# Patient Record
Sex: Female | Born: 1965 | Race: White | Hispanic: No | Marital: Married | Smoking: Never smoker
Health system: Southern US, Community
[De-identification: ages and names within clinical notes are randomized; demographics above are authoritative.]

---

## 1999-05-12 ENCOUNTER — Other Ambulatory Visit: Admission: RE | Admit: 1999-05-12 | Discharge: 1999-05-12 | Payer: Self-pay | Admitting: Obstetrics and Gynecology

## 2000-04-21 ENCOUNTER — Other Ambulatory Visit: Admission: RE | Admit: 2000-04-21 | Discharge: 2000-04-21 | Payer: Self-pay | Admitting: Obstetrics and Gynecology

## 2000-06-06 ENCOUNTER — Encounter: Payer: Self-pay | Admitting: Obstetrics and Gynecology

## 2000-06-06 ENCOUNTER — Ambulatory Visit (HOSPITAL_COMMUNITY): Admission: RE | Admit: 2000-06-06 | Discharge: 2000-06-06 | Payer: Self-pay | Admitting: Obstetrics and Gynecology

## 2001-04-24 ENCOUNTER — Other Ambulatory Visit: Admission: RE | Admit: 2001-04-24 | Discharge: 2001-04-24 | Payer: Self-pay | Admitting: Obstetrics and Gynecology

## 2001-08-07 ENCOUNTER — Ambulatory Visit (HOSPITAL_COMMUNITY): Admission: RE | Admit: 2001-08-07 | Discharge: 2001-08-07 | Payer: Self-pay | Admitting: *Deleted

## 2002-06-28 ENCOUNTER — Other Ambulatory Visit: Admission: RE | Admit: 2002-06-28 | Discharge: 2002-06-28 | Payer: Self-pay | Admitting: Obstetrics and Gynecology

## 2003-06-30 ENCOUNTER — Other Ambulatory Visit: Admission: RE | Admit: 2003-06-30 | Discharge: 2003-06-30 | Payer: Self-pay | Admitting: Obstetrics and Gynecology

## 2003-07-03 ENCOUNTER — Encounter: Admission: RE | Admit: 2003-07-03 | Discharge: 2003-07-03 | Payer: Self-pay | Admitting: Obstetrics and Gynecology

## 2004-06-25 ENCOUNTER — Other Ambulatory Visit: Admission: RE | Admit: 2004-06-25 | Discharge: 2004-06-25 | Payer: Self-pay | Admitting: Obstetrics and Gynecology

## 2004-07-08 ENCOUNTER — Encounter: Admission: RE | Admit: 2004-07-08 | Discharge: 2004-07-08 | Payer: Self-pay | Admitting: Obstetrics and Gynecology

## 2005-06-29 ENCOUNTER — Other Ambulatory Visit: Admission: RE | Admit: 2005-06-29 | Discharge: 2005-06-29 | Payer: Self-pay | Admitting: Obstetrics and Gynecology

## 2005-07-20 ENCOUNTER — Encounter: Admission: RE | Admit: 2005-07-20 | Discharge: 2005-07-20 | Payer: Self-pay | Admitting: Obstetrics and Gynecology

## 2006-07-24 ENCOUNTER — Encounter: Admission: RE | Admit: 2006-07-24 | Discharge: 2006-07-24 | Payer: Self-pay | Admitting: Obstetrics and Gynecology

## 2007-07-25 ENCOUNTER — Encounter: Admission: RE | Admit: 2007-07-25 | Discharge: 2007-07-25 | Payer: Self-pay | Admitting: Obstetrics and Gynecology

## 2008-07-25 ENCOUNTER — Encounter: Admission: RE | Admit: 2008-07-25 | Discharge: 2008-07-25 | Payer: Self-pay | Admitting: Obstetrics and Gynecology

## 2009-08-03 ENCOUNTER — Encounter: Admission: RE | Admit: 2009-08-03 | Discharge: 2009-08-03 | Payer: Self-pay | Admitting: Obstetrics and Gynecology

## 2009-08-06 ENCOUNTER — Encounter: Admission: RE | Admit: 2009-08-06 | Discharge: 2009-08-06 | Payer: Self-pay | Admitting: Obstetrics and Gynecology

## 2009-12-31 ENCOUNTER — Encounter: Admission: RE | Admit: 2009-12-31 | Discharge: 2009-12-31 | Payer: Self-pay | Admitting: Family Medicine

## 2010-07-12 ENCOUNTER — Other Ambulatory Visit: Payer: Self-pay | Admitting: Obstetrics and Gynecology

## 2010-07-12 DIAGNOSIS — Z1231 Encounter for screening mammogram for malignant neoplasm of breast: Secondary | ICD-10-CM

## 2010-08-10 ENCOUNTER — Ambulatory Visit
Admission: RE | Admit: 2010-08-10 | Discharge: 2010-08-10 | Disposition: A | Discharge status: Home / Self Care | Payer: 59 | Source: Ambulatory Visit | Attending: Obstetrics and Gynecology | Admitting: Obstetrics and Gynecology

## 2010-08-10 DIAGNOSIS — Z1231 Encounter for screening mammogram for malignant neoplasm of breast: Secondary | ICD-10-CM

## 2010-09-17 ENCOUNTER — Other Ambulatory Visit: Payer: Self-pay | Admitting: Obstetrics and Gynecology

## 2010-09-17 DIAGNOSIS — N6310 Unspecified lump in the right breast, unspecified quadrant: Secondary | ICD-10-CM

## 2010-09-23 ENCOUNTER — Ambulatory Visit
Admission: RE | Admit: 2010-09-23 | Discharge: 2010-09-23 | Disposition: A | Discharge status: Home / Self Care | Payer: 59 | Source: Ambulatory Visit | Attending: Obstetrics and Gynecology | Admitting: Obstetrics and Gynecology

## 2010-09-23 DIAGNOSIS — N6310 Unspecified lump in the right breast, unspecified quadrant: Secondary | ICD-10-CM

## 2010-10-01 NOTE — Op Note (Signed)
[facility]  Patient:    Lisa Benitez, Lisa Benitez Visit Number: [id number] MRN: [id number]          Service Type: DSU Location: [facility] Attending Physician:  Vikki Ports Dictated by:   Vikki Ports, M.D. Admit Date:  08/07/2001   CC:         Marina Gravel, M.D.   Operative Report  PREOPERATIVE DIAGNOSIS:       Umbilical hernia.  POSTOPERATIVE DIAGNOSIS:      Umbilical hernia.  PROCEDURE:                    Umbilical hernia repair.  SURGEON:                      Vikki Ports, M.D.  ANESTHESIA:                   General.  DESCRIPTION OF PROCEDURE:     The patient was taken to the operating room, placed in supine position and then in lithotomy.  After adequate anesthesia was induced, using endotracheal tube, the abdomen and perineum were prepped and draped in the normal sterile fashion.  A transverse incision was made just under the umbilicus.  Hernia sac was identified, dissected free of surrounding structures.  It was freed up off the fascia.  The peritoneum was opened, and an 0 Vicryl pursestring suture was placed around the fascial defect.  The Hasson trocar was placed in the abdomen, and Dr. Earlene Plater proceeded with bilateral tubal ligation.  This will be dictated in a separate report.  After he completed his part of the procedure, the Hasson trocar was removed.  The fascial defect measured about 1.5 cm.  It was closed with figure-of-eight 0 Vicryl sutures.  I was satisfied with the repair.  The skin was closed with subcuticular 4-0 Monocryl.  All tissues were injected using 0.5% Marcaine. The patient tolerated the procedure well and went to PACU in good condition. Dictated by:   Vikki Ports, M.D. Attending Physician:  Danna Hefty R. DD:  08/07/01 TD:  08/08/01 Job: [id number]

## 2010-10-01 NOTE — H&P (Signed)
[facility]  Patient:    Lisa Benitez, Lisa Benitez Visit Number: [id number] MRN: [id number]          Service Type: DSU Location: [facility] Attending Physician:  Jaymes Graff A Dictated by:   Marina Gravel, M.D. Adm. Date:  08/07/01   CC:         Vikki Ports, M.D., general surgery   History and Physical  DATE OF BIRTH:                [birth year]  SOCIAL SECURITY NUMBER:       [id number]  CHIEF COMPLAINT:              Desires tubal sterilization.  HISTORY OF PRESENT ILLNESS:   A 45 year old white female gravida 2 para 2 seen at the request of Dr. Danna Hefty.  Has plans for umbilical hernia repair on the above date and desire permanent tubal sterilization at the same time.  Is currently taking Ortho Tri-Cyclen for contraception and is aware of potential changes in menstrual cycle after discontinuation.  Has considered other alternative methods of contraception and has decided to proceed with tubal sterilization.  PAST MEDICAL HISTORY:         None.  PAST SURGICAL HISTORY:        Laser of cervix for dysplasia.  PAST OBSTETRICAL HISTORY:     Two spontaneous vaginal deliveries without complications.  MEDICATIONS:                  Ortho Tri-Cyclen.  ALLERGIES:                    None.  SOCIAL HISTORY:               No tobacco, social alcohol and other drugs.  FAMILY HISTORY:               Noncontributory.  REVIEW OF SYSTEMS:            Otherwise negative.  PHYSICAL EXAMINATION:  VITAL SIGNS:                  Blood pressure 100/60, pulse 76, weight 130, height 5 feet 6 inches.  GENERAL:                      The patient is normal body habitus, well developed, no deformities.  NECK:                         Supple, no thyromegaly.  HEART:                        Regular rate and rhythm.  LUNGS:                        Clear to auscultation.  ABDOMEN:                      Notable for a 1-2 cm reducible umbilical  hernia. Liver and spleen are normal and no other abdomen items are present.  LYMPH NODE SURVEY:            Negative at the neck.  SKIN:                         Normal without lesions.  PELVIC:  Deferred as she had a normal Pap smear in December 2002 and normal physical exam at that time.  Plan for exam under anesthesia prior to tubal sterilization.  ASSESSMENT:                   Desires permanent sterilization.  PLAN:                         Interval laparoscopic tubal sterilization and repair of umbilical hernia in combination with Dr. Danna Hefty. Operative risks for the tubal discussed including infection; bleeding; damage to bowel, bladder, surrounding organs.  Also discussed the permanent nature of sterilization, the failure rate of 2-3% for 10 years, increased ectopic risk if pregnant.  The patient has considered alternative methods such as IUD or continuing birth control pills but wishes to proceed with laparoscopic tubal. Dictated by:   Marina Gravel, M.D. Attending Physician:  Michael Litter DD:  07/27/01 TD:  07/27/01 Job: [id number]

## 2010-10-01 NOTE — Op Note (Signed)
[place]  Patient:    Lisa Benitez, Lisa Benitez Visit Number: [id number] MRN: [id number]          Service Type: DSU Location: [facility] Attending Physician:  Vikki Ports. Dictated by:   Marina Gravel, M.D. Proc. Date: 08/07/01 Admit Date:  08/07/2001                             Operative Report  PREOPERATIVE DIAGNOSES:       1. Undesired fertility.                               2. Umbilical hernia.  POSTOPERATIVE DIAGNOSES:      1. Undesired fertility.                               2. Umbilical hernia.  PROCEDURES:                   1. Open laparoscopy, laparoscopic tubal sterilization with bipolar cautery.                               2. Repair of umbilical hernia (Dr. Luan Pulling).  SURGEONS:                     Marina Gravel, M.D.                               Vikki Ports, M.D.  ANESTHESIA:                   General.  FINDINGS:                     A less than 1 cm umbilical hernia. Normal-appearing uterus, tubes and ovaries.  The remainder of the pelvis appeared normal.  Normal appearing appendix, liver edge, gallbladder and stomach.  INDICATIONS:                  The patient desires permanent sterilization. Failure rate, ectopic risk, operative risks were discussed prior to the procedure.  PROCEDURE:                    The patient was taken to the operating room and adequate general anesthesia obtained.  She was placed in the ski position and examined under anesthesia.  She was found to have an anterior uterus of normal size without adnexal masses.  Rectovaginal exam confirmed. She was prepped and draped in standard fashion, and bladder emptied with a red rubber catheter.  A speculum was inserted and the anterior lip of the cervix grasped with the toothed tenaculum, and Hulka tucked tenaculum inserted.  Attention was then turned to the umbilicus.  Dr. Luan Pulling entered the abdomen and placed a pursestring 0-Vicryl  suture around the fascial defect. We inserted a Hasson cannula through the defect and secured with a suture.  A pneumoperitoneum was obtained with CO2 gas.  The patient was placed in Trendelenburg position and bowel mobilized superiorly.  The pelvis and abdomen were inspected, with the above findings noted.  The right tube as identified and followed to its fimbriated end.  It was then cauteryized with bipolar cauter in a "triple  burn" fashion.  We repeated this on the opposite side in similar fashion.  The scope was removed and gas released.  The umbilical hernia closed by Dr. Luan Pulling.  (Please see her dictation for complete details in this regard).  The patient tolerated the procedure well and there were no complications. She was taken to the recovery room awake, alert and in stable condition. Dictated by:   Marina Gravel, M.D. Attending Physician:  Danna Hefty R. DD:  08/07/01 TD:  08/08/01 Job: [id number]

## 2011-03-01 ENCOUNTER — Other Ambulatory Visit: Payer: Self-pay | Admitting: Obstetrics and Gynecology

## 2011-03-01 DIAGNOSIS — N63 Unspecified lump in unspecified breast: Secondary | ICD-10-CM

## 2011-03-09 ENCOUNTER — Other Ambulatory Visit: Payer: Self-pay | Admitting: Obstetrics and Gynecology

## 2011-03-09 ENCOUNTER — Ambulatory Visit
Admission: RE | Admit: 2011-03-09 | Discharge: 2011-03-09 | Disposition: A | Discharge status: Home / Self Care | Payer: 59 | Source: Ambulatory Visit | Attending: Obstetrics and Gynecology | Admitting: Obstetrics and Gynecology

## 2011-03-09 DIAGNOSIS — N63 Unspecified lump in unspecified breast: Secondary | ICD-10-CM

## 2011-07-04 ENCOUNTER — Other Ambulatory Visit: Payer: Self-pay | Admitting: Obstetrics and Gynecology

## 2011-07-04 DIAGNOSIS — Z1231 Encounter for screening mammogram for malignant neoplasm of breast: Secondary | ICD-10-CM

## 2011-08-24 ENCOUNTER — Ambulatory Visit
Admission: RE | Admit: 2011-08-24 | Discharge: 2011-08-24 | Disposition: A | Discharge status: Home / Self Care | Payer: 59 | Source: Ambulatory Visit | Attending: Obstetrics and Gynecology | Admitting: Obstetrics and Gynecology

## 2011-08-24 DIAGNOSIS — Z1231 Encounter for screening mammogram for malignant neoplasm of breast: Secondary | ICD-10-CM

## 2011-09-19 ENCOUNTER — Ambulatory Visit (INDEPENDENT_AMBULATORY_CARE_PROVIDER_SITE_OTHER): Payer: 59 | Admitting: Obstetrics and Gynecology

## 2011-09-19 ENCOUNTER — Encounter: Payer: Self-pay | Admitting: Obstetrics and Gynecology

## 2011-09-19 VITALS — BP 106/68 | HR 70 | Ht 66.0 in | Wt 131.0 lb

## 2011-09-19 DIAGNOSIS — Z124 Encounter for screening for malignant neoplasm of cervix: Secondary | ICD-10-CM

## 2011-09-19 NOTE — Progress Notes (Signed)
Last Pap: 09/17/2010 WNL: Yes Regular Periods:yes Contraception: BTL, pill  Monthly Breast exam:yes Tetanus<4yrs:yes Nl.Bladder Function:yes Daily BMs:yes Healthy Diet:yes Calcium:yes Mammogram:yes April 2013 per pt Exercise:yes Seatbelt: yes Abuse at home: no Stressful work:no Sigmoid-colonoscopy: no Bone Density: No Jimmey Ralph, Charetha Subjective:  Physical Examination: Neck - supple, no significant adenopathy Chest - clear to auscultation, no wheezes, rales or rhonchi, symmetric air entry Heart - normal rate, regular rhythm, normal S1, S2, no murmurs, rubs, clicks or gallops Abdomen - soft, nontender, nondistended, no masses or organomegaly Breasts - breasts appear normal, no suspicious masses, no skin or nipple changes or axillary nodes Pelvic - normal external genitalia, vulva, vagina, cervix, uterus and adnexa, atrophic Rectal - normal rectal, no masses Musculoskeletal - no joint tenderness, deformity or swelling Extremities - peripheral pulses normal, no pedal edema, no clubbing or cyanosis Skin - normal coloration and turgor, no rashes, no suspicious skin lesions noted Normal AEX Pt due for mammogram done and normal per pt  colonoscopy due no Pap done repeat in 3 years RT one year Diet and exercise discussed

## 2011-09-21 LAB — PAP IG W/ RFLX HPV ASCU

## 2011-09-30 ENCOUNTER — Other Ambulatory Visit: Payer: Self-pay

## 2011-09-30 ENCOUNTER — Telehealth: Payer: Self-pay | Admitting: Obstetrics and Gynecology

## 2011-09-30 NOTE — Telephone Encounter (Signed)
Patient emailed the office and stated that she had an annual this month and her rx for microgestin had not been issued to [facility]. Please handle. Thanks AF

## 2011-09-30 NOTE — Telephone Encounter (Signed)
Niccole/epic/e-mail

## 2011-09-30 NOTE — Telephone Encounter (Signed)
Lm on vm rx sent to pharm can pick up at earliest convience

## 2011-09-30 NOTE — Telephone Encounter (Signed)
Patient emailed the office and stated that she had an annual this month and her rx for microgestin had not been issued to [facility] [address]. Please handle. Thanks AF   

## 2011-10-18 ENCOUNTER — Other Ambulatory Visit: Payer: Self-pay | Admitting: Obstetrics and Gynecology

## 2011-10-28 NOTE — Telephone Encounter (Signed)
Rx request received for Junel 1/20 e-pres to pharm.

## 2012-03-26 ENCOUNTER — Other Ambulatory Visit: Payer: Self-pay | Admitting: Obstetrics and Gynecology

## 2012-08-06 ENCOUNTER — Other Ambulatory Visit: Payer: Self-pay

## 2012-08-06 DIAGNOSIS — Z1231 Encounter for screening mammogram for malignant neoplasm of breast: Secondary | ICD-10-CM

## 2012-09-05 ENCOUNTER — Ambulatory Visit
Admission: RE | Admit: 2012-09-05 | Discharge: 2012-09-05 | Disposition: A | Discharge status: Home / Self Care | Payer: 59 | Source: Ambulatory Visit

## 2012-09-05 DIAGNOSIS — Z1231 Encounter for screening mammogram for malignant neoplasm of breast: Secondary | ICD-10-CM

## 2012-12-05 ENCOUNTER — Other Ambulatory Visit: Payer: Self-pay | Admitting: Family Medicine

## 2012-12-05 DIAGNOSIS — N6310 Unspecified lump in the right breast, unspecified quadrant: Secondary | ICD-10-CM

## 2012-12-05 DIAGNOSIS — N6320 Unspecified lump in the left breast, unspecified quadrant: Secondary | ICD-10-CM

## 2012-12-19 ENCOUNTER — Ambulatory Visit
Admission: RE | Admit: 2012-12-19 | Discharge: 2012-12-19 | Disposition: A | Discharge status: Home / Self Care | Payer: 59 | Source: Ambulatory Visit | Attending: Family Medicine | Admitting: Family Medicine

## 2012-12-19 DIAGNOSIS — N6320 Unspecified lump in the left breast, unspecified quadrant: Secondary | ICD-10-CM

## 2012-12-19 DIAGNOSIS — N6310 Unspecified lump in the right breast, unspecified quadrant: Secondary | ICD-10-CM

## 2013-07-03 ENCOUNTER — Other Ambulatory Visit: Payer: Self-pay

## 2013-07-03 DIAGNOSIS — Z1231 Encounter for screening mammogram for malignant neoplasm of breast: Secondary | ICD-10-CM

## 2013-09-06 ENCOUNTER — Ambulatory Visit
Admission: RE | Admit: 2013-09-06 | Discharge: 2013-09-06 | Disposition: A | Discharge status: Home / Self Care | Payer: BC Managed Care – PPO | Source: Ambulatory Visit

## 2013-09-06 DIAGNOSIS — Z1231 Encounter for screening mammogram for malignant neoplasm of breast: Secondary | ICD-10-CM

## 2014-10-14 ENCOUNTER — Other Ambulatory Visit: Payer: Self-pay

## 2014-10-14 DIAGNOSIS — Z1231 Encounter for screening mammogram for malignant neoplasm of breast: Secondary | ICD-10-CM

## 2014-10-20 ENCOUNTER — Ambulatory Visit
Admission: RE | Admit: 2014-10-20 | Discharge: 2014-10-20 | Disposition: A | Discharge status: Home / Self Care | Payer: BLUE CROSS/BLUE SHIELD | Source: Ambulatory Visit

## 2014-10-20 DIAGNOSIS — Z1231 Encounter for screening mammogram for malignant neoplasm of breast: Secondary | ICD-10-CM

## 2015-09-10 ENCOUNTER — Other Ambulatory Visit: Payer: Self-pay

## 2015-09-10 DIAGNOSIS — Z1231 Encounter for screening mammogram for malignant neoplasm of breast: Secondary | ICD-10-CM

## 2015-10-21 ENCOUNTER — Other Ambulatory Visit: Payer: Self-pay | Admitting: Obstetrics and Gynecology

## 2015-10-21 ENCOUNTER — Ambulatory Visit
Admission: RE | Admit: 2015-10-21 | Discharge: 2015-10-21 | Disposition: A | Discharge status: Home / Self Care | Payer: 59 | Source: Ambulatory Visit

## 2015-10-21 DIAGNOSIS — Z1231 Encounter for screening mammogram for malignant neoplasm of breast: Secondary | ICD-10-CM

## 2015-10-26 ENCOUNTER — Other Ambulatory Visit: Payer: Self-pay | Admitting: Obstetrics and Gynecology

## 2015-10-26 DIAGNOSIS — R928 Other abnormal and inconclusive findings on diagnostic imaging of breast: Secondary | ICD-10-CM

## 2015-10-30 ENCOUNTER — Ambulatory Visit
Admission: RE | Admit: 2015-10-30 | Discharge: 2015-10-30 | Disposition: A | Discharge status: Home / Self Care | Payer: 59 | Source: Ambulatory Visit | Attending: Obstetrics and Gynecology | Admitting: Obstetrics and Gynecology

## 2015-10-30 DIAGNOSIS — R928 Other abnormal and inconclusive findings on diagnostic imaging of breast: Secondary | ICD-10-CM

## 2016-01-04 ENCOUNTER — Other Ambulatory Visit: Payer: Self-pay | Admitting: Obstetrics and Gynecology

## 2016-01-04 DIAGNOSIS — N6009 Solitary cyst of unspecified breast: Secondary | ICD-10-CM

## 2016-01-07 ENCOUNTER — Other Ambulatory Visit: Payer: Self-pay | Admitting: Obstetrics and Gynecology

## 2016-01-07 ENCOUNTER — Ambulatory Visit
Admission: RE | Admit: 2016-01-07 | Discharge: 2016-01-07 | Disposition: A | Discharge status: Home / Self Care | Payer: 59 | Source: Ambulatory Visit | Attending: Obstetrics and Gynecology | Admitting: Obstetrics and Gynecology

## 2016-01-07 DIAGNOSIS — N6009 Solitary cyst of unspecified breast: Secondary | ICD-10-CM

## 2016-01-07 HISTORY — PX: BREAST CYST ASPIRATION: SHX578

## 2016-09-20 ENCOUNTER — Other Ambulatory Visit: Payer: Self-pay | Admitting: Obstetrics and Gynecology

## 2016-09-20 DIAGNOSIS — Z1231 Encounter for screening mammogram for malignant neoplasm of breast: Secondary | ICD-10-CM

## 2016-10-15 DIAGNOSIS — J028 Acute pharyngitis due to other specified organisms: Secondary | ICD-10-CM | POA: Diagnosis not present

## 2016-10-21 ENCOUNTER — Ambulatory Visit
Admission: RE | Admit: 2016-10-21 | Discharge: 2016-10-21 | Disposition: A | Discharge status: Home / Self Care | Payer: 59 | Source: Ambulatory Visit | Attending: Obstetrics and Gynecology | Admitting: Obstetrics and Gynecology

## 2016-10-21 DIAGNOSIS — Z1231 Encounter for screening mammogram for malignant neoplasm of breast: Secondary | ICD-10-CM

## 2016-11-01 DIAGNOSIS — Z0000 Encounter for general adult medical examination without abnormal findings: Secondary | ICD-10-CM | POA: Diagnosis not present

## 2016-11-08 DIAGNOSIS — Z0000 Encounter for general adult medical examination without abnormal findings: Secondary | ICD-10-CM | POA: Diagnosis not present

## 2017-04-12 DIAGNOSIS — Z23 Encounter for immunization: Secondary | ICD-10-CM | POA: Diagnosis not present

## 2017-06-13 DIAGNOSIS — Z0100 Encounter for examination of eyes and vision without abnormal findings: Secondary | ICD-10-CM | POA: Diagnosis not present

## 2017-06-20 ENCOUNTER — Ambulatory Visit: Payer: Self-pay | Admitting: Family

## 2017-06-20 ENCOUNTER — Encounter: Payer: Self-pay | Admitting: Family

## 2017-06-20 VITALS — BP 100/75 | Temp 99.0°F | Resp 16 | Wt 139.6 lb

## 2017-06-20 DIAGNOSIS — J029 Acute pharyngitis, unspecified: Secondary | ICD-10-CM

## 2017-06-20 MED ORDER — AMOXICILLIN 500 MG PO CAPS: 500.0000 mg | ORAL_CAPSULE | Freq: Two times a day (BID) | ORAL | 0 refills | Status: AC

## 2017-06-20 NOTE — Progress Notes (Signed)
   Subjective:    Patient ID: Lisa Benitez, female    DOB: 07/23/1965, 52 y.o.   MRN: [id number]  Sore Throat   This is a new problem. The current episode started in the past 7 days. The problem has been rapidly worsening. Maximum temperature: 99. The pain is at a severity of 8/10. The pain is moderate. Associated symptoms include ear pain, a hoarse voice, a plugged ear sensation and trouble swallowing. Pertinent negatives include no congestion, coughing, ear discharge or headaches. She has tried acetaminophen, gargles and NSAIDs for the symptoms. The treatment provided mild relief.  Sinus Problem  Associated symptoms include ear pain and a hoarse voice. Pertinent negatives include no congestion, coughing or headaches.  Ear Fullness   Pertinent negatives include no coughing, ear discharge or headaches.      Review of Systems  HENT: Positive for ear pain, hoarse voice and trouble swallowing. Negative for congestion and ear discharge.   Respiratory: Negative for cough.   Neurological: Negative for headaches.  All other systems reviewed and are negative.      Objective:   Physical Exam  Constitutional: She is oriented to person, place, and time. She appears well-developed and well-nourished. No distress.  HENT:  Head: Normocephalic and atraumatic.  Right Ear: External ear normal.  Nose: Rhinorrhea present.  Mouth/Throat: Posterior oropharyngeal erythema present.  Eyes: Pupils are equal, round, and reactive to light.  Neck: Normal range of motion. Neck supple. No thyromegaly present.  Cardiovascular: Normal rate, regular rhythm, normal heart sounds and intact distal pulses.  No murmur heard. Pulmonary/Chest: Effort normal and breath sounds normal. No respiratory distress. She has no wheezes.  Abdominal: Soft. Bowel sounds are normal. She exhibits no distension. There is no tenderness.  Musculoskeletal: Normal range of motion. She exhibits no edema or tenderness.   Neurological: She is alert and oriented to person, place, and time. She has normal reflexes. No cranial nerve deficit.  Skin: Skin is warm and dry.  Psychiatric: She has a normal mood and affect. Her behavior is normal. Judgment and thought content normal.  Vitals reviewed.     BP 100/75 (BP Location: Right Arm, Patient Position: Sitting, Cuff Size: Normal)   Temp 99 F (37.2 C)   Resp 16   Wt 139 lb 9.6 oz (63.3 kg)   BMI 22.53 kg/m      Assessment & Plan:  1. Acute pharyngitis, unspecified etiology - Take meds as prescribed - Use a cool mist humidifier  -Force fluids -For any cough or congestion  Use plain Mucinex- regular strength or max strength is fine -For fever or aces or pains- take tylenol or ibuprofen appropriate for age and weight. -Throat lozenges if help -New toothbrush in 3 days - amoxicillin (AMOXIL) 500 MG capsule; Take 1 capsule (500 mg total) by mouth 2 (two) times daily.  Dispense: 20 capsule; Refill: 0    Evelina Dun, FNP

## 2017-06-20 NOTE — Patient Instructions (Signed)
Strep Throat Strep throat is a bacterial infection of the throat. Your health care provider may call the infection tonsillitis or pharyngitis, depending on whether there is swelling in the tonsils or at the back of the throat. Strep throat is most common during the cold months of the year in children who are 5-53 years of age, but it can happen during any season in people of any age. This infection is spread from person to person (contagious) through coughing, sneezing, or close contact. What are the causes? Strep throat is caused by the bacteria called Streptococcus pyogenes. What increases the risk? This condition is more likely to develop in:  People who spend time in crowded places where the infection can spread easily.  People who have close contact with someone who has strep throat.  What are the signs or symptoms? Symptoms of this condition include:  Fever or chills.  Redness, swelling, or pain in the tonsils or throat.  Pain or difficulty when swallowing.  White or yellow spots on the tonsils or throat.  Swollen, tender glands in the neck or under the jaw.  Red rash all over the body (rare).  How is this diagnosed? This condition is diagnosed by performing a rapid strep test or by taking a swab of your throat (throat culture test). Results from a rapid strep test are usually ready in a few minutes, but throat culture test results are available after one or two days. How is this treated? This condition is treated with antibiotic medicine. Follow these instructions at home: Medicines  Take over-the-counter and prescription medicines only as told by your health care provider.  Take your antibiotic as told by your health care provider. Do not stop taking the antibiotic even if you start to feel better.  Have family members who also have a sore throat or fever tested for strep throat. They may need antibiotics if they have the strep infection. Eating and drinking  Do not  share food, drinking cups, or personal items that could cause the infection to spread to other people.  If swallowing is difficult, try eating soft foods until your sore throat feels better.  Drink enough fluid to keep your urine clear or pale yellow. General instructions  Gargle with a salt-water mixture 3-4 times per day or as needed. To make a salt-water mixture, completely dissolve -1 tsp of salt in 1 cup of warm water.  Make sure that all household members wash their hands well.  Get plenty of rest.  Stay home from school or work until you have been taking antibiotics for 24 hours.  Keep all follow-up visits as told by your health care provider. This is important. Contact a health care provider if:  The glands in your neck continue to get bigger.  You develop a rash, cough, or earache.  You cough up a thick liquid that is green, yellow-brown, or bloody.  You have pain or discomfort that does not get better with medicine.  Your problems seem to be getting worse rather than better.  You have a fever. Get help right away if:  You have new symptoms, such as vomiting, severe headache, stiff or painful neck, chest pain, or shortness of breath.  You have severe throat pain, drooling, or changes in your voice.  You have swelling of the neck, or the skin on the neck becomes red and tender.  You have signs of dehydration, such as fatigue, dry mouth, and decreased urination.  You become increasingly sleepy, or   you cannot wake up completely.  Your joints become red or painful. This information is not intended to replace advice given to you by your health care provider. Make sure you discuss any questions you have with your health care provider. Document Released: 04/29/2000 Document Revised: 12/30/2015 Document Reviewed: 08/25/2014 Elsevier Interactive Patient Education  2018 [employer]  

## 2017-06-22 ENCOUNTER — Telehealth: Payer: Self-pay

## 2017-06-22 NOTE — Telephone Encounter (Signed)
Called to f/u with pt to see how she was feeling since her visit with [place] and pt states she is doing much better.

## 2017-06-23 ENCOUNTER — Other Ambulatory Visit: Payer: Self-pay | Admitting: Obstetrics and Gynecology

## 2017-06-23 DIAGNOSIS — Z1231 Encounter for screening mammogram for malignant neoplasm of breast: Secondary | ICD-10-CM

## 2017-06-28 DIAGNOSIS — L814 Other melanin hyperpigmentation: Secondary | ICD-10-CM | POA: Diagnosis not present

## 2017-06-28 DIAGNOSIS — D485 Neoplasm of uncertain behavior of skin: Secondary | ICD-10-CM | POA: Diagnosis not present

## 2017-06-28 DIAGNOSIS — D2239 Melanocytic nevi of other parts of face: Secondary | ICD-10-CM | POA: Diagnosis not present

## 2017-06-28 DIAGNOSIS — L573 Poikiloderma of Civatte: Secondary | ICD-10-CM | POA: Diagnosis not present

## 2017-06-28 DIAGNOSIS — L430 Hypertrophic lichen planus: Secondary | ICD-10-CM | POA: Diagnosis not present

## 2017-09-12 DIAGNOSIS — N921 Excessive and frequent menstruation with irregular cycle: Secondary | ICD-10-CM | POA: Diagnosis not present

## 2017-09-19 DIAGNOSIS — R946 Abnormal results of thyroid function studies: Secondary | ICD-10-CM | POA: Diagnosis not present

## 2017-10-23 ENCOUNTER — Ambulatory Visit
Admission: RE | Admit: 2017-10-23 | Discharge: 2017-10-23 | Disposition: A | Discharge status: Home / Self Care | Payer: 59 | Source: Ambulatory Visit | Attending: Obstetrics and Gynecology | Admitting: Obstetrics and Gynecology

## 2017-10-23 ENCOUNTER — Encounter: Payer: Self-pay | Admitting: Radiology

## 2017-10-23 DIAGNOSIS — Z1231 Encounter for screening mammogram for malignant neoplasm of breast: Secondary | ICD-10-CM | POA: Diagnosis not present

## 2018-08-27 DIAGNOSIS — L570 Actinic keratosis: Secondary | ICD-10-CM | POA: Diagnosis not present

## 2018-08-27 DIAGNOSIS — D2261 Melanocytic nevi of right upper limb, including shoulder: Secondary | ICD-10-CM | POA: Diagnosis not present

## 2018-08-27 DIAGNOSIS — D2262 Melanocytic nevi of left upper limb, including shoulder: Secondary | ICD-10-CM | POA: Diagnosis not present

## 2018-08-27 DIAGNOSIS — I788 Other diseases of capillaries: Secondary | ICD-10-CM | POA: Diagnosis not present

## 2018-09-14 ENCOUNTER — Other Ambulatory Visit: Payer: Self-pay | Admitting: Obstetrics and Gynecology

## 2018-09-14 DIAGNOSIS — Z1231 Encounter for screening mammogram for malignant neoplasm of breast: Secondary | ICD-10-CM

## 2018-11-08 ENCOUNTER — Ambulatory Visit: Payer: 59

## 2018-11-15 ENCOUNTER — Ambulatory Visit
Admission: RE | Admit: 2018-11-15 | Discharge: 2018-11-15 | Disposition: A | Discharge status: Home / Self Care | Payer: 59 | Source: Ambulatory Visit | Attending: Obstetrics and Gynecology | Admitting: Obstetrics and Gynecology

## 2018-11-15 ENCOUNTER — Other Ambulatory Visit: Payer: Self-pay

## 2018-11-15 DIAGNOSIS — Z1231 Encounter for screening mammogram for malignant neoplasm of breast: Secondary | ICD-10-CM

## 2018-11-21 ENCOUNTER — Ambulatory Visit: Payer: 59

## 2019-09-10 ENCOUNTER — Other Ambulatory Visit: Payer: Self-pay | Admitting: Obstetrics and Gynecology

## 2019-09-10 DIAGNOSIS — Z1231 Encounter for screening mammogram for malignant neoplasm of breast: Secondary | ICD-10-CM

## 2019-11-25 ENCOUNTER — Ambulatory Visit
Admission: RE | Admit: 2019-11-25 | Discharge: 2019-11-25 | Disposition: A | Discharge status: Home / Self Care | Payer: 59 | Source: Ambulatory Visit | Attending: Obstetrics and Gynecology | Admitting: Obstetrics and Gynecology

## 2019-11-25 ENCOUNTER — Other Ambulatory Visit: Payer: Self-pay

## 2019-11-25 DIAGNOSIS — Z1231 Encounter for screening mammogram for malignant neoplasm of breast: Secondary | ICD-10-CM

## 2020-09-08 ENCOUNTER — Other Ambulatory Visit: Payer: Self-pay | Admitting: Obstetrics and Gynecology

## 2020-09-08 DIAGNOSIS — Z1231 Encounter for screening mammogram for malignant neoplasm of breast: Secondary | ICD-10-CM

## 2020-11-26 ENCOUNTER — Ambulatory Visit
Admission: RE | Admit: 2020-11-26 | Discharge: 2020-11-26 | Disposition: A | Discharge status: Home / Self Care | Payer: 59 | Source: Ambulatory Visit | Attending: Obstetrics and Gynecology | Admitting: Obstetrics and Gynecology

## 2020-11-26 ENCOUNTER — Other Ambulatory Visit: Payer: Self-pay

## 2020-11-26 DIAGNOSIS — Z1231 Encounter for screening mammogram for malignant neoplasm of breast: Secondary | ICD-10-CM

## 2021-08-08 IMAGING — MG MM DIGITAL SCREENING BILAT W/ TOMO AND CAD
8 series · 9 of 24 positions shown · non-contrast
Comparison: Previous exam(s).

CLINICAL DATA: Screening.

EXAM:
DIGITAL SCREENING BILATERAL MAMMOGRAM WITH TOMOSYNTHESIS AND CAD
TECHNIQUE: Bilateral screening digital craniocaudal and mediolateral oblique
mammograms were obtained. Bilateral screening digital breast
tomosynthesis was performed. The images were evaluated with
computer-aided detection.

[L CC synth-2D]
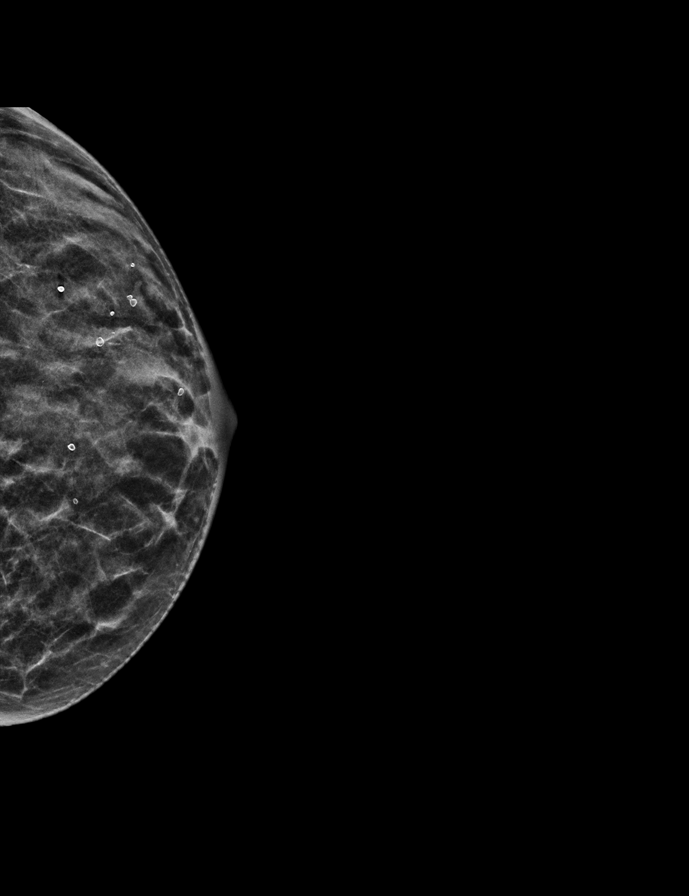

[L MLO synth-2D]
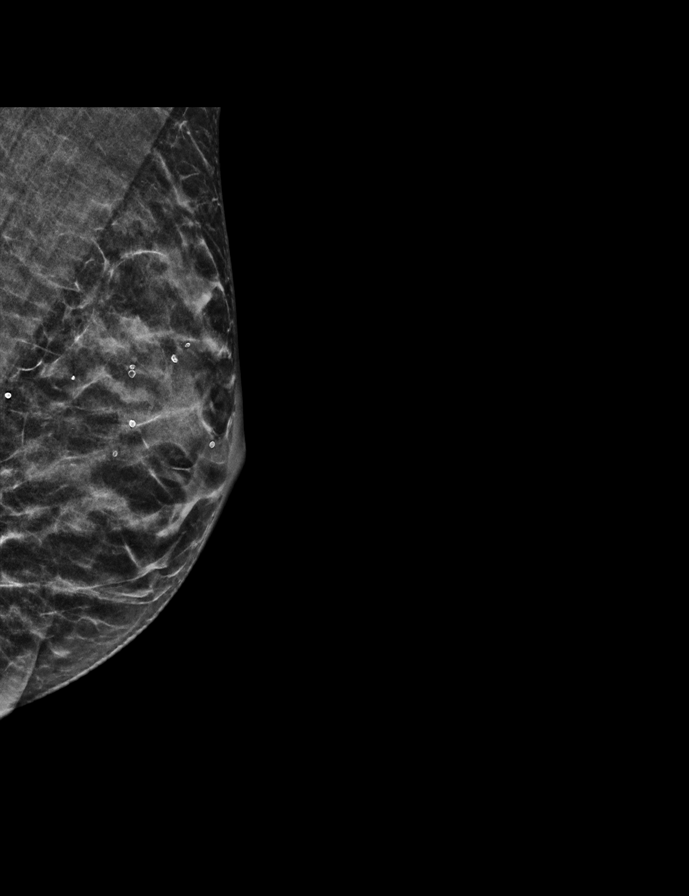

[R CC synth-2D]
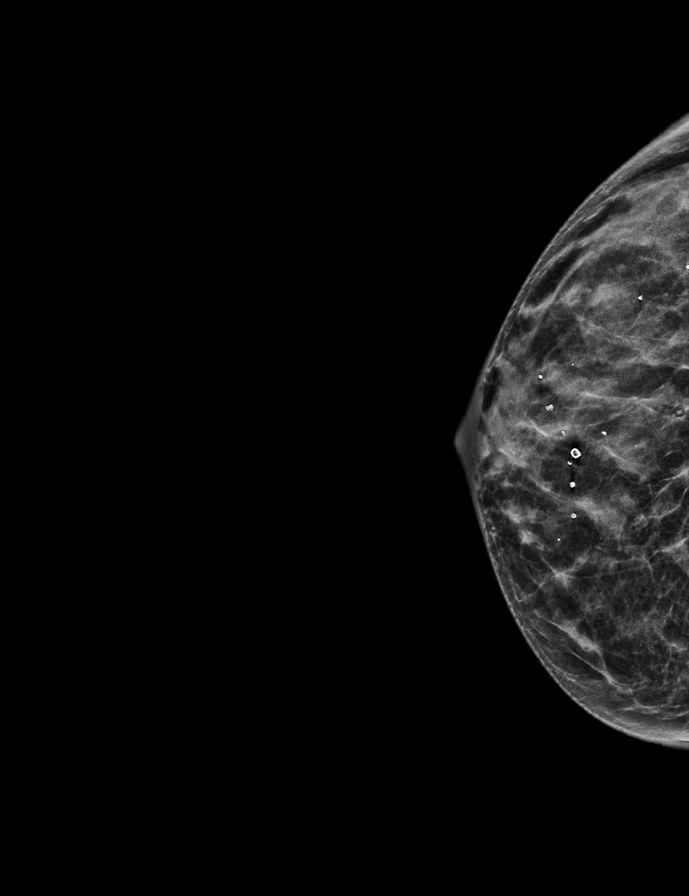

[R MLO synth-2D]
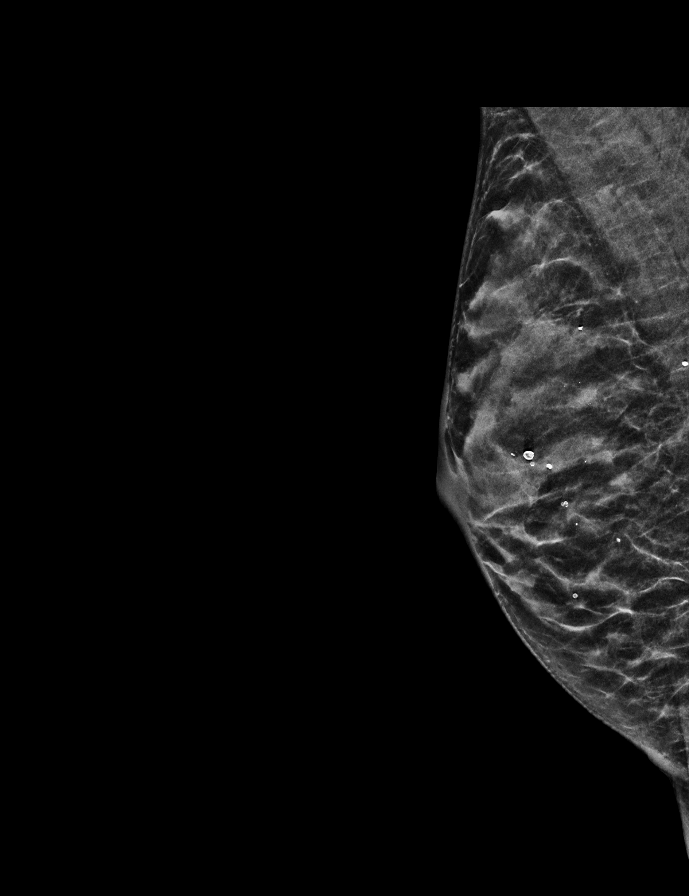

[L MLO tomo · 2 of 42 frames shown]
[frame 14/42]
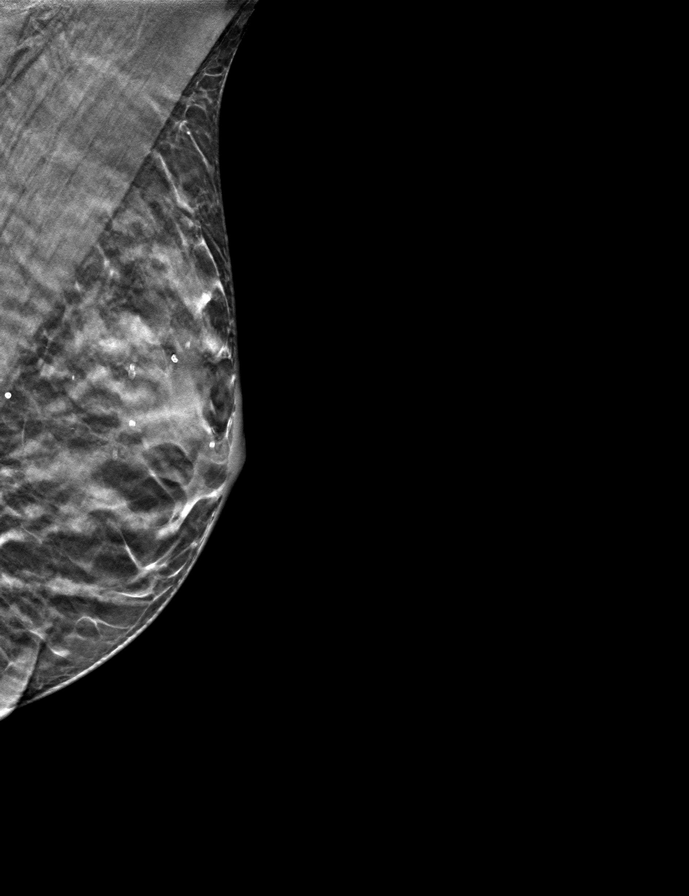
[frame 21/42]
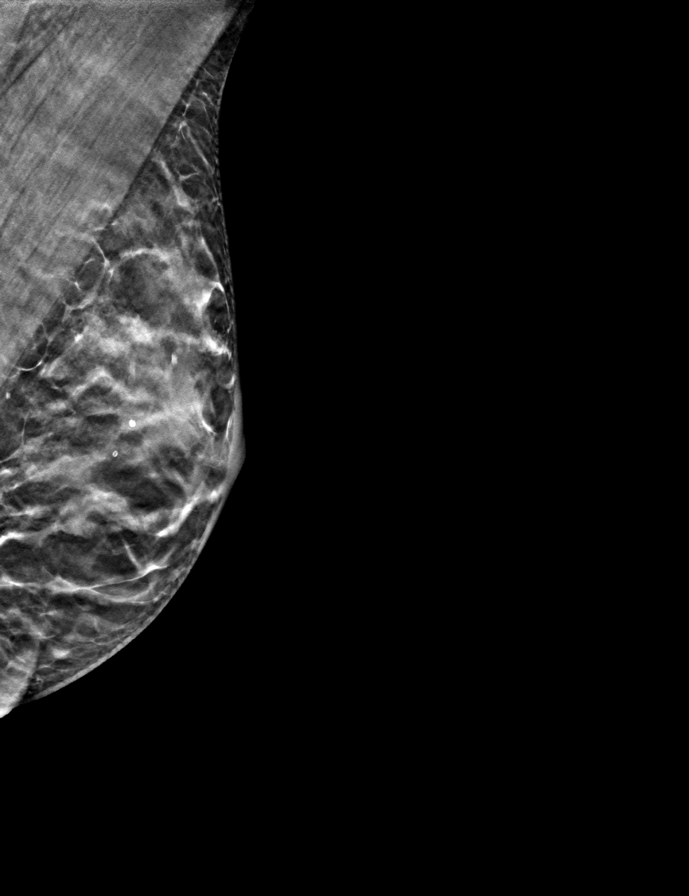

[L CC tomo · tomo slice 19/37.0]
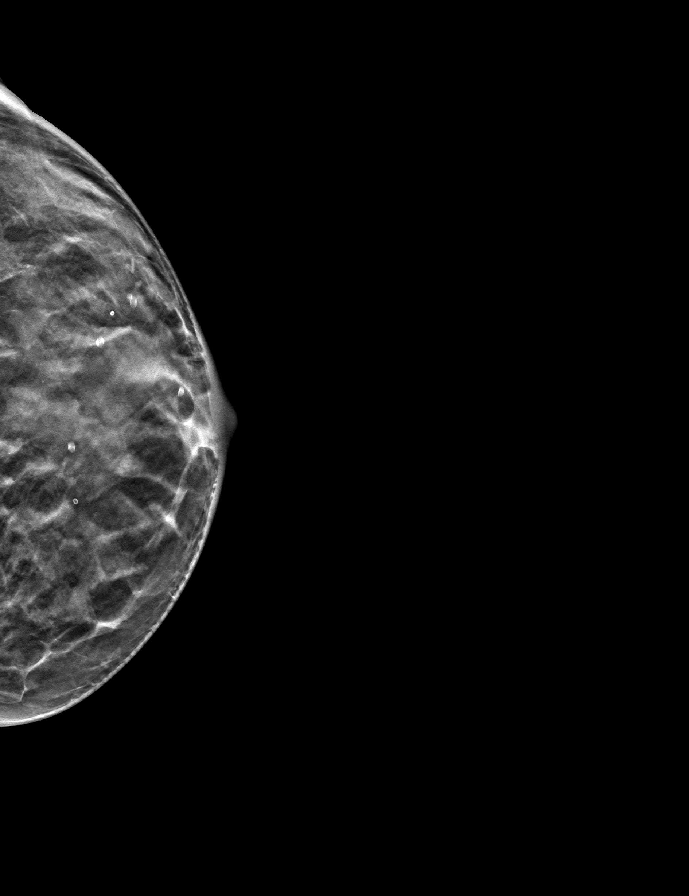

[R MLO tomo · tomo slice 21/40.0]
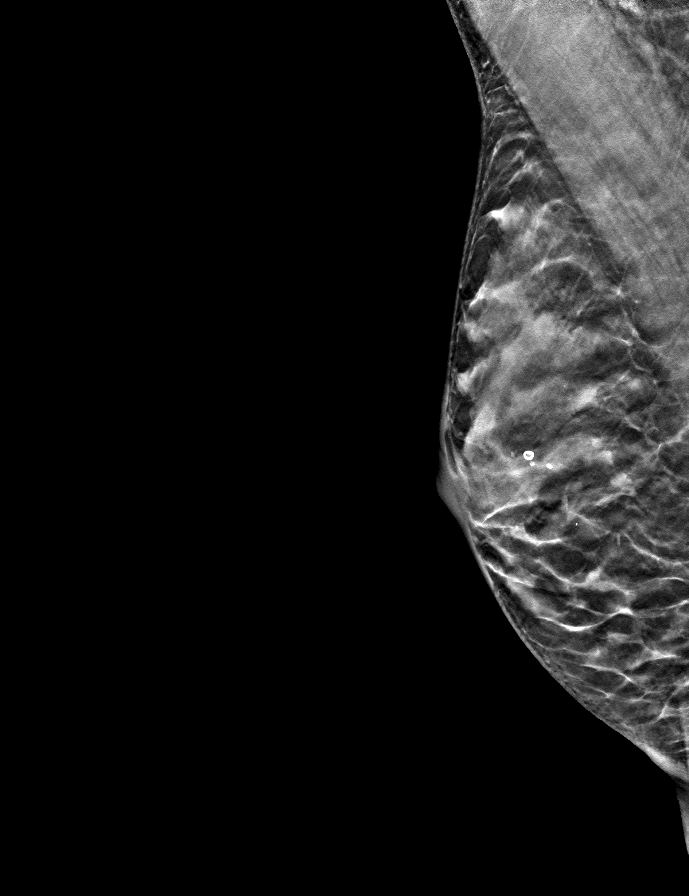

[R CC tomo · tomo slice 21/40.0]
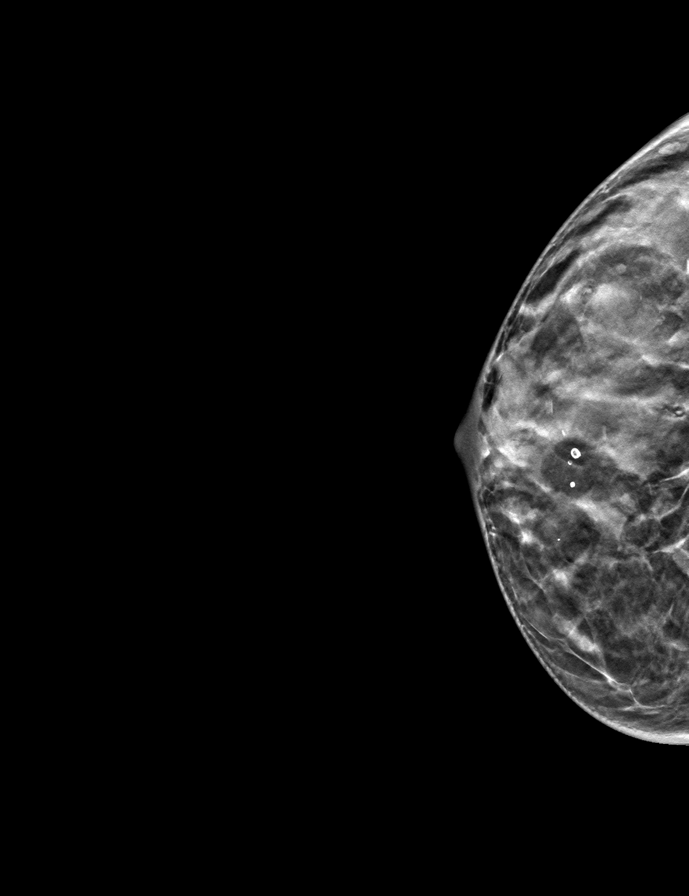

[9 of 24 positions shown; findings below may reference images not displayed]

ACR Breast Density Category c: The breast tissue is heterogeneously
dense, which may obscure small masses.
FINDINGS: There are no findings suspicious for malignancy.
IMPRESSION: No mammographic evidence of malignancy. A result letter of this
screening mammogram will be mailed directly to the patient.

RECOMMENDATION:
Screening mammogram in one year. (Code:Q3-W-BC3)

BI-RADS CATEGORY  1: Negative.

## 2021-10-13 ENCOUNTER — Other Ambulatory Visit: Payer: Self-pay | Admitting: Obstetrics and Gynecology

## 2021-10-13 DIAGNOSIS — Z1231 Encounter for screening mammogram for malignant neoplasm of breast: Secondary | ICD-10-CM

## 2021-11-29 ENCOUNTER — Ambulatory Visit
Admission: RE | Admit: 2021-11-29 | Discharge: 2021-11-29 | Disposition: A | Discharge status: Home / Self Care | Payer: BC Managed Care – PPO | Source: Ambulatory Visit | Attending: Obstetrics and Gynecology | Admitting: Obstetrics and Gynecology

## 2021-11-29 DIAGNOSIS — Z1231 Encounter for screening mammogram for malignant neoplasm of breast: Secondary | ICD-10-CM | POA: Diagnosis not present

## 2021-11-30 DIAGNOSIS — L819 Disorder of pigmentation, unspecified: Secondary | ICD-10-CM | POA: Diagnosis not present

## 2021-11-30 DIAGNOSIS — D2261 Melanocytic nevi of right upper limb, including shoulder: Secondary | ICD-10-CM | POA: Diagnosis not present

## 2021-11-30 DIAGNOSIS — L821 Other seborrheic keratosis: Secondary | ICD-10-CM | POA: Diagnosis not present

## 2021-11-30 DIAGNOSIS — D225 Melanocytic nevi of trunk: Secondary | ICD-10-CM | POA: Diagnosis not present

## 2021-12-01 DIAGNOSIS — Z01411 Encounter for gynecological examination (general) (routine) with abnormal findings: Secondary | ICD-10-CM | POA: Diagnosis not present

## 2021-12-01 DIAGNOSIS — Z6820 Body mass index (BMI) 20.0-20.9, adult: Secondary | ICD-10-CM | POA: Diagnosis not present

## 2021-12-01 DIAGNOSIS — Z01419 Encounter for gynecological examination (general) (routine) without abnormal findings: Secondary | ICD-10-CM | POA: Diagnosis not present

## 2022-05-05 DIAGNOSIS — J029 Acute pharyngitis, unspecified: Secondary | ICD-10-CM | POA: Diagnosis not present

## 2022-05-05 DIAGNOSIS — Z03818 Encounter for observation for suspected exposure to other biological agents ruled out: Secondary | ICD-10-CM | POA: Diagnosis not present

## 2022-10-24 ENCOUNTER — Other Ambulatory Visit: Payer: Self-pay | Admitting: Obstetrics and Gynecology

## 2022-10-24 DIAGNOSIS — Z1231 Encounter for screening mammogram for malignant neoplasm of breast: Secondary | ICD-10-CM

## 2022-12-02 ENCOUNTER — Ambulatory Visit
Admission: RE | Admit: 2022-12-02 | Discharge: 2022-12-02 | Disposition: A | Discharge status: Home / Self Care | Payer: BC Managed Care – PPO | Source: Ambulatory Visit | Attending: Obstetrics and Gynecology | Admitting: Obstetrics and Gynecology

## 2022-12-02 DIAGNOSIS — Z1231 Encounter for screening mammogram for malignant neoplasm of breast: Secondary | ICD-10-CM

## 2023-01-02 DIAGNOSIS — L578 Other skin changes due to chronic exposure to nonionizing radiation: Secondary | ICD-10-CM | POA: Diagnosis not present

## 2023-01-02 DIAGNOSIS — L821 Other seborrheic keratosis: Secondary | ICD-10-CM | POA: Diagnosis not present

## 2023-03-15 DIAGNOSIS — Z01411 Encounter for gynecological examination (general) (routine) with abnormal findings: Secondary | ICD-10-CM | POA: Diagnosis not present

## 2023-03-15 DIAGNOSIS — Z01419 Encounter for gynecological examination (general) (routine) without abnormal findings: Secondary | ICD-10-CM | POA: Diagnosis not present

## 2023-05-22 DIAGNOSIS — K130 Diseases of lips: Secondary | ICD-10-CM | POA: Diagnosis not present

## 2023-12-04 ENCOUNTER — Other Ambulatory Visit: Payer: Self-pay | Admitting: Obstetrics and Gynecology

## 2023-12-04 DIAGNOSIS — Z1231 Encounter for screening mammogram for malignant neoplasm of breast: Secondary | ICD-10-CM

## 2023-12-21 ENCOUNTER — Ambulatory Visit
Admission: RE | Admit: 2023-12-21 | Discharge: 2023-12-21 | Disposition: A | Discharge status: Home / Self Care | Source: Ambulatory Visit | Attending: Obstetrics and Gynecology | Admitting: Obstetrics and Gynecology

## 2023-12-21 DIAGNOSIS — Z1231 Encounter for screening mammogram for malignant neoplasm of breast: Secondary | ICD-10-CM

## 2024-03-28 ENCOUNTER — Other Ambulatory Visit (HOSPITAL_BASED_OUTPATIENT_CLINIC_OR_DEPARTMENT_OTHER): Payer: Self-pay | Admitting: Obstetrics and Gynecology

## 2024-03-28 DIAGNOSIS — Z13820 Encounter for screening for osteoporosis: Secondary | ICD-10-CM
